# Patient Record
Sex: Female | Born: 1935 | Race: White | Hispanic: No | Smoking: Never smoker
Health system: Southern US, Community
[De-identification: ages and names within clinical notes are randomized; demographics above are authoritative.]

## PROBLEM LIST (undated history)

## (undated) DIAGNOSIS — E7800 Pure hypercholesterolemia, unspecified: Secondary | ICD-10-CM

## (undated) DIAGNOSIS — E079 Disorder of thyroid, unspecified: Secondary | ICD-10-CM

## (undated) DIAGNOSIS — I10 Essential (primary) hypertension: Secondary | ICD-10-CM

## (undated) HISTORY — PX: ABDOMINAL HYSTERECTOMY: SHX81

---

## 2011-01-31 ENCOUNTER — Ambulatory Visit
Admission: RE | Admit: 2011-01-31 | Discharge: 2011-01-31 | Disposition: A | Discharge status: Home / Self Care | Payer: Medicare Other | Source: Ambulatory Visit | Attending: Family Medicine | Admitting: Family Medicine

## 2011-01-31 ENCOUNTER — Other Ambulatory Visit: Payer: Self-pay | Admitting: Family Medicine

## 2011-01-31 ENCOUNTER — Encounter: Payer: Self-pay | Admitting: Family Medicine

## 2011-01-31 ENCOUNTER — Inpatient Hospital Stay (INDEPENDENT_AMBULATORY_CARE_PROVIDER_SITE_OTHER)
Admission: RE | Admit: 2011-01-31 | Discharge: 2011-01-31 | Disposition: A | Discharge status: Home / Self Care | Payer: Medicare Other | Source: Ambulatory Visit | Attending: Family Medicine | Admitting: Family Medicine

## 2011-01-31 DIAGNOSIS — M25569 Pain in unspecified knee: Secondary | ICD-10-CM

## 2011-01-31 DIAGNOSIS — E039 Hypothyroidism, unspecified: Secondary | ICD-10-CM | POA: Insufficient documentation

## 2011-01-31 DIAGNOSIS — I10 Essential (primary) hypertension: Secondary | ICD-10-CM | POA: Insufficient documentation

## 2011-07-03 NOTE — Progress Notes (Signed)
Summary: LT KNEE PAIN Room 5   Vital Signs:  Patient Profile:   77 Years Old Female CC:      Left knee pain x 2 days Height:     60 inches Weight:      169 pounds O2 Sat:      98 % O2 treatment:    Room Air Temp:     97.6 degrees F oral Pulse rate:   95 / minute Pulse rhythm:   regular Resp:     16 per minute BP sitting:   171 / 103  (left arm) Cuff size:   regular  Pt. in pain?   yes    Location:   knee    Intensity:   8    Type:       aching  Vitals Entered By: Emilio Math (January 31, 2011 5:51 PM)                   Current Allergies: No known allergies History of Present Illness Chief Complaint: Left knee pain x 2 days History of Present Illness:  Subjective:  Patient complains of some mild pain in left knee about two weeks ago after wearing some new shoes.  The pain resolved, but yesterday after sitting in a recliner for a while, she developed recurrent pain upon standing.  She now has pain toward the anterior aspect of her left knee upon standing and walking.  No swelling.  She recalls no trauma to the knee or change in activities.  No calf pain.  Current Meds LISINOPRIL 20 MG TABS (LISINOPRIL)  SYNTHROID 25 MCG TABS (LEVOTHYROXINE SODIUM)  VOLTAREN 1 % GEL (DICLOFENAC SODIUM) Apply four gm to affected joint three times a day to qid  REVIEW OF SYSTEMS Constitutional Symptoms      Denies fever, chills, night sweats, weight loss, weight gain, and fatigue.  Eyes       Denies change in vision, eye pain, eye discharge, glasses, contact lenses, and eye surgery. Ear/Nose/Throat/Mouth       Denies hearing loss/aids, change in hearing, ear pain, ear discharge, dizziness, frequent runny nose, frequent nose bleeds, sinus problems, sore throat, hoarseness, and tooth pain or bleeding.  Respiratory       Denies dry cough, productive cough, wheezing, shortness of breath, asthma, bronchitis, and emphysema/COPD.  Cardiovascular       Denies murmurs, chest pain, and tires  easily with exhertion.    Gastrointestinal       Denies stomach pain, nausea/vomiting, diarrhea, constipation, blood in bowel movements, and indigestion. Genitourniary       Denies painful urination, kidney stones, and loss of urinary control. Neurological       Denies paralysis, seizures, and fainting/blackouts. Musculoskeletal       Complains of muscle pain, joint pain, joint stiffness, and decreased range of motion.      Denies redness, swelling, muscle weakness, and gout.  Skin       Denies bruising, unusual mles/lumps or sores, and hair/skin or nail changes.  Psych       Denies mood changes, temper/anger issues, anxiety/stress, speech problems, depression, and sleep problems.  Past History:  Past Medical History: Hypertension Hypothyroidism  Past Surgical History: Hysterectomy Ankle  Social History: Lives alone   Objective:  Appearance:  Elderly female in no acute distress, alert and oriented  Left knee:  No effusion,  erythema, or warmth.  Mild swelling anteriorly.  Knee stable, negative drawer test.  McMurray test negative.  There  is tenderness over the lateral joint line, and just medial to the patella.  No tenderness in the popliteal area, and no calf tenderness.   Homan's test negative.  No calf warmth or erythema. X-ray left knee:  IMPRESSION: No acute osseous findings.  Meniscal chondrocalcinosis. Assessment New Problems: CHONDROCALCINOSIS (ICD-275.49) KNEE PAIN, LEFT, ACUTE (ICD-719.46) HYPOTHYROIDISM (ICD-244.9) HYPERTENSION (ICD-401.9)  PSEUDOGOUT  Plan New Medications/Changes: VOLTAREN 1 % GEL (DICLOFENAC SODIUM) Apply four gm to affected joint three times a day to qid  #100gm x 1, 01/31/2011, Donna Christen MD  New Orders: T-DG Knee 2 Views*L* [73560] New Patient Level III [99203] Services provided After hours-Weekends-Holidays [99051] Ace Wraps 3-5 in/yard  [W0981] Planning Comments:   Ace wrap applied.  Apply ice pack for 30 to 45 minutes every 1  to 4 hours.  Continue until pain decreases.  Begin Voltaren gel.  Recommend using walker for several days until improved. Followup with [facility] if not improved in two weeks.   The patient and/or caregiver has been counseled thoroughly with regard to medications prescribed including dosage, schedule, interactions, rationale for use, and possible side effects and they verbalize understanding.  Diagnoses and expected course of recovery discussed and will return if not improved as expected or if the condition worsens. Patient and/or caregiver verbalized understanding.  Prescriptions: VOLTAREN 1 % GEL (DICLOFENAC SODIUM) Apply four gm to affected joint three times a day to qid  #100gm x 1   Entered and Authorized by:   Donna Christen MD   Signed by:   Donna Christen MD on 01/31/2011   Method used:   Print then Give to Patient   RxID:   [id number]   Orders Added: 1)  T-DG Knee 2 Views*L* [73560] 2)  New Patient Level III [57846] 3)  Services provided After hours-Weekends-Holidays [99051] 4)  Ace Wraps 3-5 in/yard  [[id number]]

## 2014-05-16 ENCOUNTER — Emergency Department (INDEPENDENT_AMBULATORY_CARE_PROVIDER_SITE_OTHER): Payer: Medicare Other

## 2014-05-16 ENCOUNTER — Emergency Department (INDEPENDENT_AMBULATORY_CARE_PROVIDER_SITE_OTHER)
Admission: EM | Admit: 2014-05-16 | Discharge: 2014-05-16 | Disposition: A | Discharge status: Home / Self Care | Payer: Medicare Other | Source: Home / Self Care | Attending: Emergency Medicine | Admitting: Emergency Medicine

## 2014-05-16 ENCOUNTER — Encounter: Payer: Self-pay | Admitting: Emergency Medicine

## 2014-05-16 DIAGNOSIS — W19XXXA Unspecified fall, initial encounter: Secondary | ICD-10-CM

## 2014-05-16 DIAGNOSIS — M25579 Pain in unspecified ankle and joints of unspecified foot: Secondary | ICD-10-CM

## 2014-05-16 DIAGNOSIS — S92302A Fracture of unspecified metatarsal bone(s), left foot, initial encounter for closed fracture: Secondary | ICD-10-CM

## 2014-05-16 DIAGNOSIS — S92352A Displaced fracture of fifth metatarsal bone, left foot, initial encounter for closed fracture: Secondary | ICD-10-CM

## 2014-05-16 HISTORY — DX: Essential (primary) hypertension: I10

## 2014-05-16 HISTORY — DX: Disorder of thyroid, unspecified: E07.9

## 2014-05-16 HISTORY — DX: Pure hypercholesterolemia, unspecified: E78.00

## 2014-05-16 NOTE — ED Notes (Signed)
Patient fell yesterday.  C/o left foot and fifth toe pain with weight bearing only.  Denies pain without weight bearing, walking.

## 2014-05-16 NOTE — ED Provider Notes (Signed)
CSN: [id number]     Arrival date & time 05/16/14  1058 History   First MD Initiated Contact with Patient 05/16/14 1137     Chief Complaint  Patient presents with  . Foot Injury   (Consider location/radiation/quality/duration/timing/severity/associated sxs/prior Treatment) Patient is a 80 y.o. female presenting with foot injury. The history is provided by the patient. No language interpreter was used.  Foot Injury Location:  Foot Time since incident:  1 day Foot location:  L foot Pain details:    Quality:  Aching   Radiates to:  Does not radiate   Severity:  Mild   Onset quality:  Gradual   Duration:  1 day   Timing:  Constant   Progression:  Worsening Chronicity:  New Dislocation: no   Foreign body present:  No foreign bodies Tetanus status:  Out of date Relieved by:  Nothing Worsened by:  Nothing tried Ineffective treatments:  None tried Associated symptoms: swelling   Risk factors: no known bone disorder     Past Medical History  Diagnosis Date  . Hypertension   . Thyroid disease   . Hypercholesterolemia    History reviewed. No pertinent past surgical history. History reviewed. No pertinent family history. History  Substance Use Topics  . Smoking status: Never Smoker   . Smokeless tobacco: Never Used  . Alcohol Use: No   OB History   Grav Para Term Preterm Abortions TAB SAB Ect Mult Living                 Review of Systems  Musculoskeletal: Positive for joint swelling.  All other systems reviewed and are negative.   Allergies  Review of patient's allergies indicates not on file.  Home Medications   Prior to Admission medications   Medication Sig Start Date End Date Taking? Authorizing Provider  Levothyroxine Sodium (SYNTHROID PO) Take by mouth.   Yes Historical Provider, MD  LISINOPRIL PO Take by mouth.   Yes Historical Provider, MD  LOVASTATIN PO Take by mouth.   Yes Historical Provider, MD   BP 206/109  Pulse 72  Temp(Src) 98 F (36.7 C)  (Oral)  Resp 18  Ht 5' (1.524 m)  Wt 165 lb (74.844 kg)  BMI 32.22 kg/m2  SpO2 98% Physical Exam  Nursing note and vitals reviewed. Constitutional: She appears well-developed and well-nourished.  HENT:  Head: Normocephalic and atraumatic.  Musculoskeletal: She exhibits tenderness.  Tender lower left foot and 5th toe,  Bruised,   nv and ns intact  Neurological: She is alert.  Skin: Skin is warm.  Psychiatric: She has a normal mood and affect.    ED Course  Procedures (including critical care time) Labs Review Labs Reviewed - No data to display  Imaging Review Dg Foot Complete Left  05/16/2014   CLINICAL DATA:  Status post fall 1 day ago with lateral left foot pain since. Initial in count.  EXAM: LEFT FOOT - COMPLETE 3+ VIEW  COMPARISON:  None.  FINDINGS: The patient has a fracture of the fifth metatarsal. The fracture extends from the proximal metaphysis distally through the distal metaphysis. The fracture is nondisplaced. No other acute bony or joint abnormality is identified. Marked soft tissue swelling about the foot is seen.  IMPRESSION: Acute fifth metatarsal fracture with associated soft tissue swelling.   Electronically Signed   By: Drusilla Kannerhomas  Dalessio M.D.   On: 05/16/2014 12:45     MDM   1. Pain in joint, ankle and foot, unspecified laterality   2.  Fracture of 5th metatarsal, left, closed, initial encounter    Ace wrap Post op shoe AVS Follow up with Dr. Karie Schwalbet in one week Tylenol for pain    Elson AreasLeslie K Andrews Tener, PA-C 05/16/14 1309

## 2014-05-16 NOTE — ED Notes (Signed)
Returns to exam room. 

## 2014-05-16 NOTE — Discharge Instructions (Signed)
Metatarsal Fracture, Undisplaced  A metatarsal fracture is a break in the bone(s) of the foot. These are the bones of the foot that connect your toes to the bones of the ankle.  DIAGNOSIS   The diagnoses of these fractures are usually made with X-rays. If there are problems in the forefoot and x-rays are normal a later bone scan will usually make the diagnosis.   TREATMENT AND HOME CARE INSTRUCTIONS  · Treatment may or may not include a cast or walking shoe. When casts are needed the use is usually for short periods of time so as not to slow down healing with muscle wasting (atrophy).  · Activities should be stopped until further advised by your caregiver.  · Wear shoes with adequate shock absorbing capabilities and stiff soles.  · Alternative exercise may be undertaken while waiting for healing. These may include bicycling and swimming, or as your caregiver suggests.  · It is important to keep all follow-up visits or specialty referrals. The failure to keep these appointments could result in improper bone healing and chronic pain or disability.  · Warning: Do not drive a car or operate a motor vehicle until your caregiver specifically tells you it is safe to do so.  IF YOU DO NOT HAVE A CAST OR SPLINT:  · You may walk on your injured foot as tolerated or advised.  · Do not put any weight on your injured foot for as long as directed by your caregiver. Slowly increase the amount of time you walk on the foot as the pain allows or as advised.  · Use crutches until you can bear weight without pain. A gradual increase in weight bearing may help.  · Apply ice to the injury for 15-20 minutes each hour while awake for the first 2 days. Put the ice in a plastic bag and place a towel between the bag of ice and your skin.  · Only take over-the-counter or prescription medicines for pain, discomfort, or fever as directed by your caregiver.  SEEK IMMEDIATE MEDICAL CARE IF:   · Your cast gets damaged or breaks.  · You have  continued severe pain or more swelling than you did before the cast was put on, or the pain is not controlled with medications.  · Your skin or nails below the injury turn blue or grey, or feel cold or numb.  · There is a bad smell, or new stains or pus-like (purulent) drainage coming from the cast.  MAKE SURE YOU:   · Understand these instructions.  · Will watch your condition.  · Will get help right away if you are not doing well or get worse.  Document Released: 04/08/2002 Document Revised: 10/09/2011 Document Reviewed: 02/28/2008  ExitCare® Patient Information ©2015 [employer]. This information is not intended to replace advice given to you by your health care provider. Make sure you discuss any questions you have with your health care provider.

## 2014-05-18 NOTE — ED Provider Notes (Signed)
Medical history/examination/treatment/procedure(s) were performed by non-physician provider and as supervising physician I was immediately available for consultation/collaboration.   Lajean Manesavid Massey, MD 05/18/14 [phone]

## 2014-05-26 ENCOUNTER — Encounter: Payer: Self-pay | Admitting: Sports Medicine

## 2014-05-26 ENCOUNTER — Ambulatory Visit (INDEPENDENT_AMBULATORY_CARE_PROVIDER_SITE_OTHER): Payer: Medicare Other | Admitting: Sports Medicine

## 2014-05-26 VITALS — BP 170/93 | HR 74 | Ht 60.0 in | Wt 171.0 lb

## 2014-05-26 DIAGNOSIS — S92352A Displaced fracture of fifth metatarsal bone, left foot, initial encounter for closed fracture: Secondary | ICD-10-CM

## 2014-05-26 MED ORDER — CALCIUM CARBONATE-VITAMIN D 600-400 MG-UNIT PO TABS: 1.0000 | ORAL_TABLET | Freq: Two times a day (BID) | ORAL | Status: AC

## 2014-05-26 NOTE — Progress Notes (Signed)
   Subjective:    I'm seeing this patient as a consultation for:  Christina MaskerKaren Sofia, PA-C  CC: left foot fracture  HPI: This is an extremely pleasant 80 year old female, several days ago she tried to stand him a stool, fell off, and injured her left foot, she had immediate pain, swelling, bruising. She was seen in urgent care where x-rays showed a fracture, and a couple of locations at the base and shaft of her fifth metatarsal. She was placed in a postop shoe appropriately, and referred to me for further evaluation and definitive treatment. Overall the foot is not painful right now, and she has been doing a good job staying off. When she does have to bear weight she uses her walker.  Past medical history, Surgical history, Family history not pertinant except as noted below, Social history, Allergies, and medications have been entered into the medical record, reviewed, and no changes needed.   Review of Systems: No headache, visual changes, nausea, vomiting, diarrhea, constipation, dizziness, abdominal pain, skin rash, fevers, chills, night sweats, weight loss, swollen lymph nodes, body aches, joint swelling, muscle aches, chest pain, shortness of breath, mood changes, visual or auditory hallucinations.   Objective:   General: Well Developed, well nourished, and in no acute distress.  Neuro/Psych: Alert and oriented x3, extra-ocular muscles intact, able to move all 4 extremities, sensation grossly intact. Skin: Warm and dry, no rashes noted.  Respiratory: Not using accessory muscles, speaking in full sentences, trachea midline.  Cardiovascular: Pulses palpable, no extremity edema. Abdomen: Does not appear distended. Left foot: Swollen, bruised, tender to palpation over the shaft and the base of the fifth metatarsal. Neurovascularly intact distally.  X-rays personally reviewed and show a spiral fracture through the distal shaft as well as the proximal base of the fifth metatarsal without angulation  or displacement.  Impression and Recommendations:   This case required medical decision making of moderate complexity.

## 2014-05-26 NOTE — Assessment & Plan Note (Signed)
Essentially pain-free. Strap with compressive dressing, continue postop shoe. Return in 3 weeks, x-ray before visit. Calcium and vitamin D supplement. Continue minimal weightbearing for the next 2 weeks, then gentle weightbearing with a walker.  I billed a fracture code for this encounter, all subsequent visits will be post-op checks in the global period.

## 2014-05-26 NOTE — Patient Instructions (Signed)
Return in 3 weeks, x-ray before visit. Calcium and vitamin D supplement. Continue minimal weightbearing for the next 2 weeks, then gentle weightbearing with a walker.

## 2014-06-16 ENCOUNTER — Ambulatory Visit (INDEPENDENT_AMBULATORY_CARE_PROVIDER_SITE_OTHER): Payer: Medicare Other | Admitting: Sports Medicine

## 2014-06-16 ENCOUNTER — Ambulatory Visit (INDEPENDENT_AMBULATORY_CARE_PROVIDER_SITE_OTHER): Payer: Medicare Other

## 2014-06-16 ENCOUNTER — Encounter: Payer: Self-pay | Admitting: Sports Medicine

## 2014-06-16 DIAGNOSIS — S92355D Nondisplaced fracture of fifth metatarsal bone, left foot, subsequent encounter for fracture with routine healing: Secondary | ICD-10-CM

## 2014-06-16 DIAGNOSIS — S92352A Displaced fracture of fifth metatarsal bone, left foot, initial encounter for closed fracture: Secondary | ICD-10-CM

## 2014-06-16 NOTE — Patient Instructions (Signed)
Continue postop shoe for another 2 weeks then discontinue and wear a regular shoe, return to see me in 3 weeks.

## 2014-06-16 NOTE — Progress Notes (Signed)
  Subjective: 3 weeks post fractures of the neck and base of the left fifth metatarsal, pain-free. She has been wearing her postop shoe and compressive wrap.   Objective: General: Well-developed, well-nourished, and in no acute distress. Left foot: No tenderness to palpation over the fracture site, she is tender at the talocrural joint.  X-rays personally reviewed, there is overall maintained alignment of the fracture with blurring of the fracture margins, there is a hint of bony callus around the fractures as well.  Assessment/plan:

## 2014-06-16 NOTE — Assessment & Plan Note (Signed)
Doing extremely well. Continue postop shoe for another 2 weeks, strapped with compressive dressing.  Return to see me in 3 weeks, single additional x-ray before visit. 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

## 2014-07-08 ENCOUNTER — Ambulatory Visit (INDEPENDENT_AMBULATORY_CARE_PROVIDER_SITE_OTHER): Payer: Medicare Other | Admitting: Sports Medicine

## 2014-07-08 ENCOUNTER — Other Ambulatory Visit: Payer: Self-pay | Admitting: Sports Medicine

## 2014-07-08 ENCOUNTER — Ambulatory Visit (INDEPENDENT_AMBULATORY_CARE_PROVIDER_SITE_OTHER): Payer: Medicare Other

## 2014-07-08 ENCOUNTER — Encounter: Payer: Self-pay | Admitting: Sports Medicine

## 2014-07-08 VITALS — BP 158/80 | HR 79 | Wt 169.0 lb

## 2014-07-08 DIAGNOSIS — S92302D Fracture of unspecified metatarsal bone(s), left foot, subsequent encounter for fracture with routine healing: Secondary | ICD-10-CM

## 2014-07-08 DIAGNOSIS — W19XXXD Unspecified fall, subsequent encounter: Secondary | ICD-10-CM

## 2014-07-08 DIAGNOSIS — S92352D Displaced fracture of fifth metatarsal bone, left foot, subsequent encounter for fracture with routine healing: Secondary | ICD-10-CM

## 2014-07-08 DIAGNOSIS — S92352A Displaced fracture of fifth metatarsal bone, left foot, initial encounter for closed fracture: Secondary | ICD-10-CM

## 2014-07-08 NOTE — Assessment & Plan Note (Signed)
Clinically resolved, return as needed. 

## 2014-07-08 NOTE — Progress Notes (Signed)
  Subjective: Approximate 6 weeks post fractures of the left neck and base of the fifth metatarsal, pain-free.   Objective: General: Well-developed, well-nourished, and in no acute distress. Left Foot: No visible erythema or swelling. Range of motion is full in all directions. Strength is 5/5 in all directions. No hallux valgus. No pes cavus or pes planus. No abnormal callus noted. No pain over the navicular prominence, or base of fifth metatarsal. No tenderness to palpation of the calcaneal insertion of plantar fascia. No pain at the Achilles insertion. No pain over the calcaneal bursa. No pain of the retrocalcaneal bursa. No tenderness to palpation over the tarsals, metatarsals, or phalanges. No hallux rigidus or limitus. No tenderness palpation over interphalangeal joints. No pain with compression of the metatarsal heads. Neurovascularly intact distally.  X-rays were personally reviewed and show good healing of the fractures with bony callus formation as expected.  Assessment/plan:

## 2015-06-16 ENCOUNTER — Encounter: Payer: Self-pay | Admitting: *Deleted

## 2015-06-16 ENCOUNTER — Emergency Department (INDEPENDENT_AMBULATORY_CARE_PROVIDER_SITE_OTHER)
Admission: EM | Admit: 2015-06-16 | Discharge: 2015-06-16 | Disposition: A | Discharge status: Home / Self Care | Payer: Medicare Other | Source: Home / Self Care | Attending: Family Medicine | Admitting: Family Medicine

## 2015-06-16 DIAGNOSIS — R300 Dysuria: Secondary | ICD-10-CM | POA: Diagnosis not present

## 2015-06-16 LAB — POCT URINALYSIS DIP (MANUAL ENTRY)
Bilirubin, UA: NEGATIVE
GLUCOSE UA: NEGATIVE
Ketones, POC UA: NEGATIVE
Leukocytes, UA: NEGATIVE
NITRITE UA: NEGATIVE
PROTEIN UA: NEGATIVE
Spec Grav, UA: 1.02 (ref 1.005–1.03)
UROBILINOGEN UA: 0.2 (ref 0–1)
pH, UA: 6 (ref 5–8)

## 2015-06-16 MED ORDER — CEPHALEXIN 500 MG PO CAPS: 500.0000 mg | ORAL_CAPSULE | Freq: Two times a day (BID) | ORAL | Status: AC

## 2015-06-16 NOTE — ED Provider Notes (Signed)
CSN: [id number]     Arrival date & time 06/16/15  1706 History   First MD Initiated Contact with Patient 06/16/15 1734     Chief Complaint  Patient presents with  . Dysuria      HPI Comments: Patient complains of onset of dysuria and frequency yesterday.  No fever. She states that she was treated for a UTI 2 weeks ago with Cipro BID for one week.  She was assymptomatic when she finished the Cipro 4 days ago.  Patient is a 81 y.o. female presenting with dysuria. The history is provided by the patient.  Dysuria Pain quality:  Burning Pain severity:  No pain Onset quality:  Sudden Duration:  1 day Timing:  Constant Progression:  Worsening Chronicity:  Recurrent Recent urinary tract infections: yes   Relieved by:  None tried Worsened by:  Nothing tried Ineffective treatments:  None tried Urinary symptoms: frequent urination and hesitancy   Urinary symptoms: no discolored urine, no foul-smelling urine, no hematuria and no bladder incontinence   Associated symptoms: no abdominal pain, no fever, no flank pain, no genital lesions, no nausea, no vaginal discharge and no vomiting     Past Medical History  Diagnosis Date  . Hypertension   . Thyroid disease   . Hypercholesterolemia    Past Surgical History  Procedure Laterality Date  . Abdominal hysterectomy     History reviewed. No pertinent family history. Social History  Substance Use Topics  . Smoking status: Never Smoker   . Smokeless tobacco: Never Used  . Alcohol Use: No   OB History    No data available     Review of Systems  Constitutional: Negative for fever.  Gastrointestinal: Negative for nausea, vomiting and abdominal pain.  Genitourinary: Positive for dysuria. Negative for flank pain and vaginal discharge.  All other systems reviewed and are negative.   Allergies  Review of patient's allergies indicates no known allergies.  Home Medications   Prior to Admission medications   Medication Sig Start Date  End Date Taking? Authorizing Provider  Calcium Carbonate-Vitamin D 600-400 MG-UNIT per tablet Take 1 tablet by mouth 2 (two) times daily. 05/26/14   Monica Bectonhomas J Thekkekandam, MD  cephALEXin (KEFLEX) 500 MG capsule Take 1 capsule (500 mg total) by mouth 2 (two) times daily. 06/16/15   Lattie HawStephen A Beese, MD  Levothyroxine Sodium (SYNTHROID PO) Take by mouth.    Historical Provider, MD  LISINOPRIL PO Take by mouth.    Historical Provider, MD  LOVASTATIN PO Take by mouth.    Historical Provider, MD   Meds Ordered and Administered this Visit  Medications - No data to display  BP 117/72 mmHg  Pulse 100  Temp(Src) 97.9 F (36.6 C) (Oral)  Resp 16  Ht 5' (1.524 m)  Wt 165 lb (74.844 kg)  BMI 32.22 kg/m2  SpO2 98% No data found.   Physical Exam Nursing notes and Vital Signs reviewed. Appearance:  Patient appears stated age, and in no acute distress Eyes:  Pupils are equal, round, and reactive to light and accomodation.  Extraocular movement is intact.  Conjunctivae are not inflamed  Nose:  Normal Pharynx:  Normal Neck:  Supple.  No adenopathy Lungs:  Clear to auscultation.  Breath sounds are equal.  Moving air well. Heart:  Regular rate and rhythm without murmurs, rubs, or gallops.  Abdomen:  Nontender without masses or hepatosplenomegaly.  Bowel sounds are present.  No CVA or flank tenderness.  Extremities:  No edema.  No calf  tenderness Skin:  No rash present.   ED Course  Procedures  None    Labs Reviewed  POCT URINALYSIS DIP (MANUAL ENTRY) - Abnormal; Notable for the following:    Blood, UA trace-intact (*)    All other components within normal limits  URINE CULTURE      MDM   1. Dysuria    Urine culture pending. Begin Keflex  BID for one week. Increase fluid intake. If symptoms become significantly worse during the night or over the weekend, proceed to the local emergency room.  Followup with Family Doctor if not improved in one week.     Lattie Haw,  MD 06/21/15 [phone]

## 2015-06-16 NOTE — Discharge Instructions (Signed)
Increase fluid intake.  May use non-prescription Cystex for about two days, if desired, to decrease urinary discomfort. If symptoms become significantly worse during the night or over the weekend, proceed to the local emergency room.    Dysuria Dysuria is pain or discomfort while urinating. The pain or discomfort may be felt in the tube that carries urine out of the bladder (urethra) or in the surrounding tissue of the genitals. The pain may also be felt in the groin area, lower abdomen, and lower back. You may have to urinate frequently or have the sudden feeling that you have to urinate (urgency). Dysuria can affect both men and women, but is more common in women. Dysuria can be caused by many different things, including:  Urinary tract infection in women.  Infection of the kidney or bladder.  Kidney stones or bladder stones.  Certain sexually transmitted infections (STIs), such as chlamydia.  Dehydration.  Inflammation of the vagina.  Use of certain medicines.  Use of certain soaps or scented products that cause irritation. HOME CARE INSTRUCTIONS Watch your dysuria for any changes. The following actions may help to reduce any discomfort you are feeling:  Drink enough fluid to keep your urine clear or pale yellow.  Empty your bladder often. Avoid holding urine for long periods of time.  After a bowel movement or urination, women should cleanse from front to back, using each tissue only once.  Empty your bladder after sexual intercourse.  Take medicines only as directed by your health care provider.  If you were prescribed an antibiotic medicine, finish it all even if you start to feel better.  Avoid caffeine, tea, and alcohol. They can irritate the bladder and make dysuria worse. In men, alcohol may irritate the prostate.  Keep all follow-up visits as directed by your health care provider. This is important.  If you had any tests done to find the cause of dysuria, it is  your responsibility to obtain your test results. Ask the lab or department performing the test when and how you will get your results. Talk with your health care provider if you have any questions about your results. SEEK MEDICAL CARE IF:  You develop pain in your back or sides.  You have a fever.  You have nausea or vomiting.  You have blood in your urine.  You are not urinating as often as you usually do. SEEK IMMEDIATE MEDICAL CARE IF:  You pain is severe and not relieved with medicines.  You are unable to hold down any fluids.  You or someone else notices a change in your mental function.  You have a rapid heartbeat at rest.  You have shaking or chills.  You feel extremely weak.   This information is not intended to replace advice given to you by your health care provider. Make sure you discuss any questions you have with your health care provider.   Document Released: 04/14/2004 Document Revised: 08/07/2014 Document Reviewed: 03/12/2014 Elsevier Interactive Patient Education [employer]2016 [employer]

## 2015-06-16 NOTE — ED Notes (Signed)
Pt c/o frequent urination and dysuria x 1 day. Denies fever. Reports recent UTI was given Cipro for 7 days on 06/06/15. S/s cleared after ABT.

## 2015-06-17 LAB — URINE CULTURE
Colony Count: NO GROWTH
ORGANISM ID, BACTERIA: NO GROWTH

## 2015-06-18 ENCOUNTER — Telehealth: Payer: Self-pay | Admitting: *Deleted

## 2015-06-20 ENCOUNTER — Telehealth: Payer: Self-pay | Admitting: *Deleted

## 2016-02-09 IMAGING — CR DG FOOT COMPLETE 3+V*L*
3 series · 3 of 3 positions shown · non-contrast
Comparison: 06/16/2014.

CLINICAL DATA: Follow-up fifth metatarsal fracture. Fall 6 weeks
ago. Follow-up evaluation .

EXAM:
LEFT FOOT - COMPLETE 3+ VIEW

[view not recorded (1 of 3)]
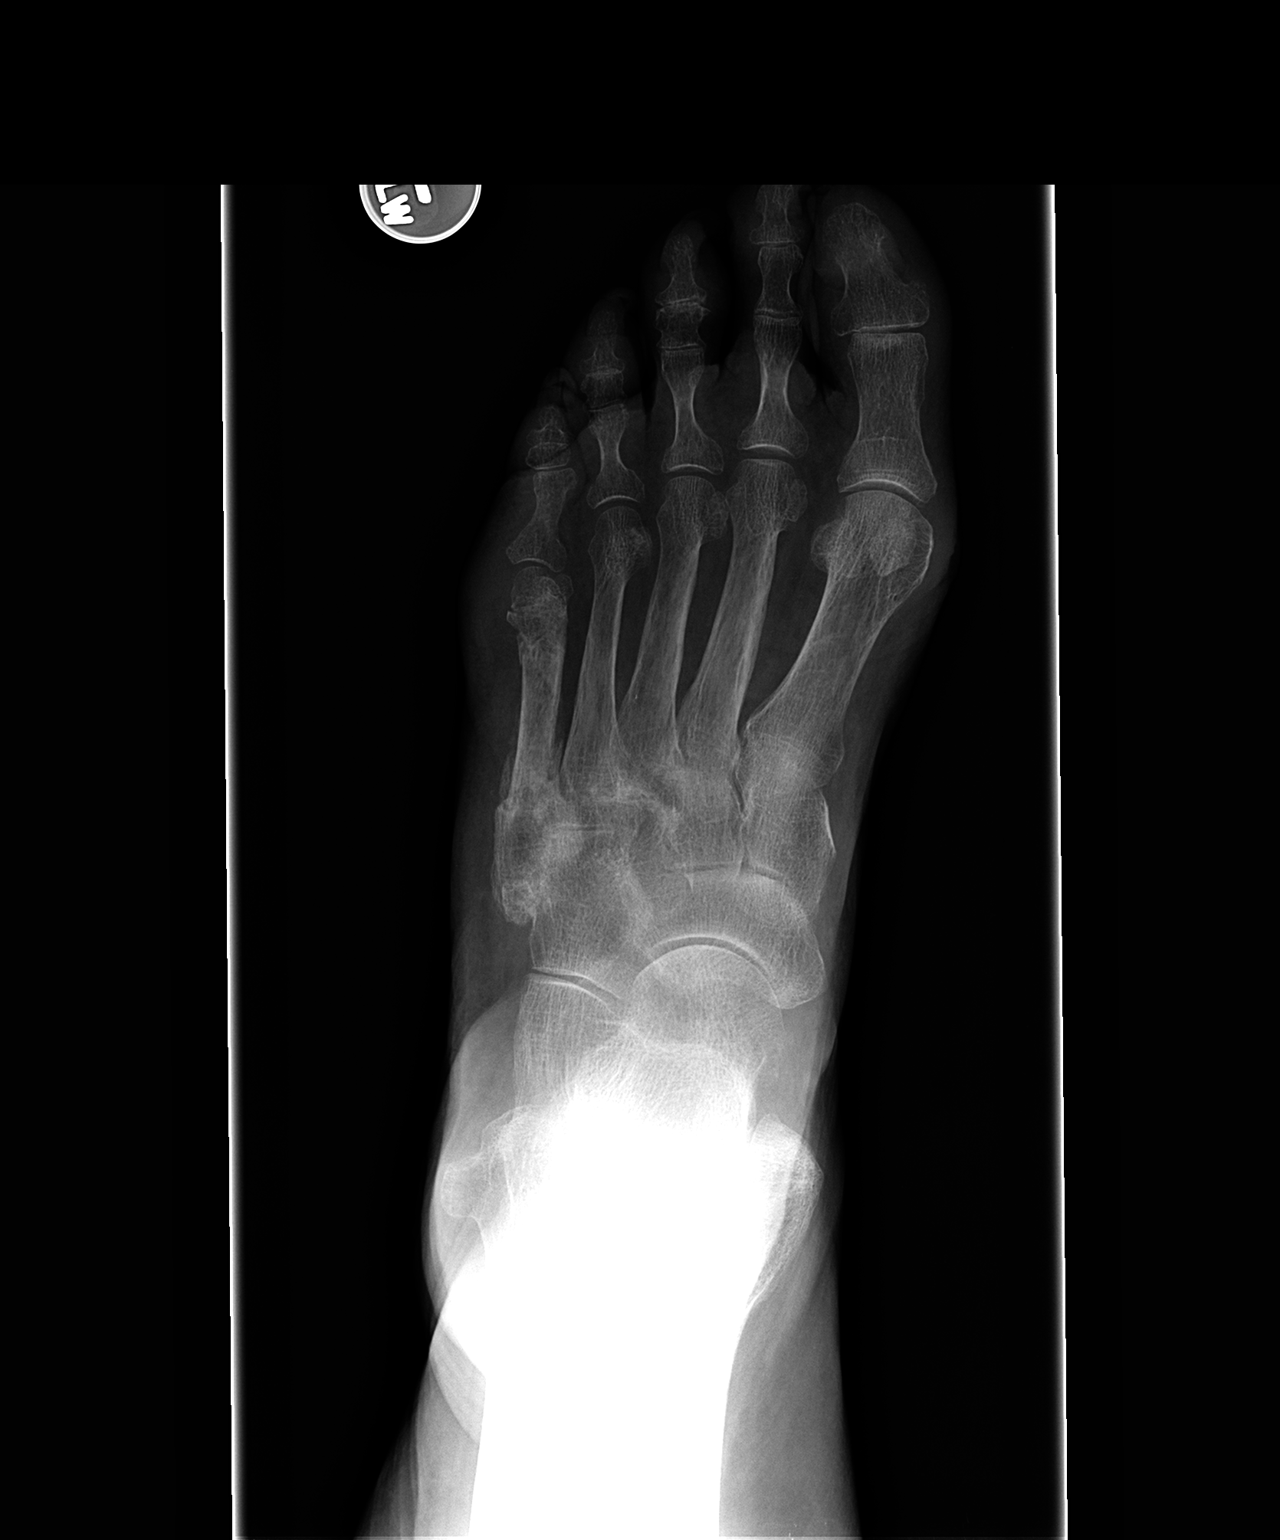

[view not recorded (2 of 3)]
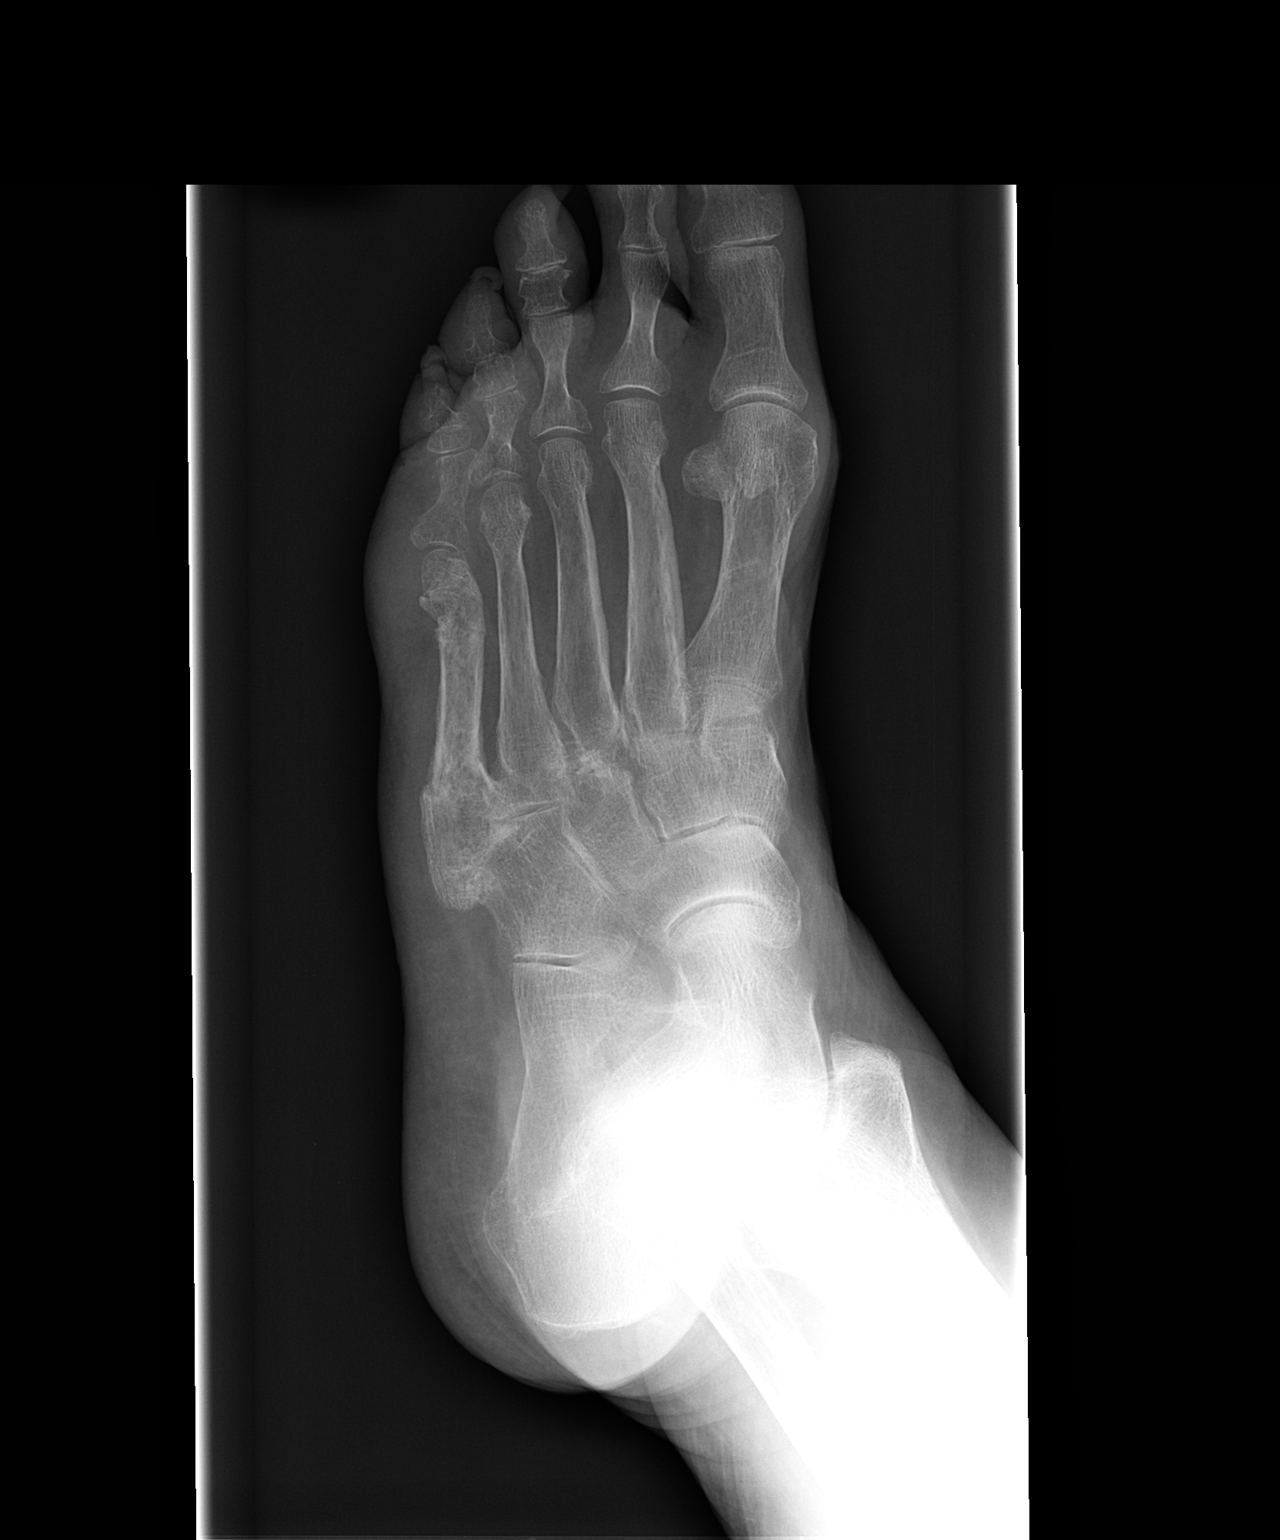

[view not recorded (3 of 3)]
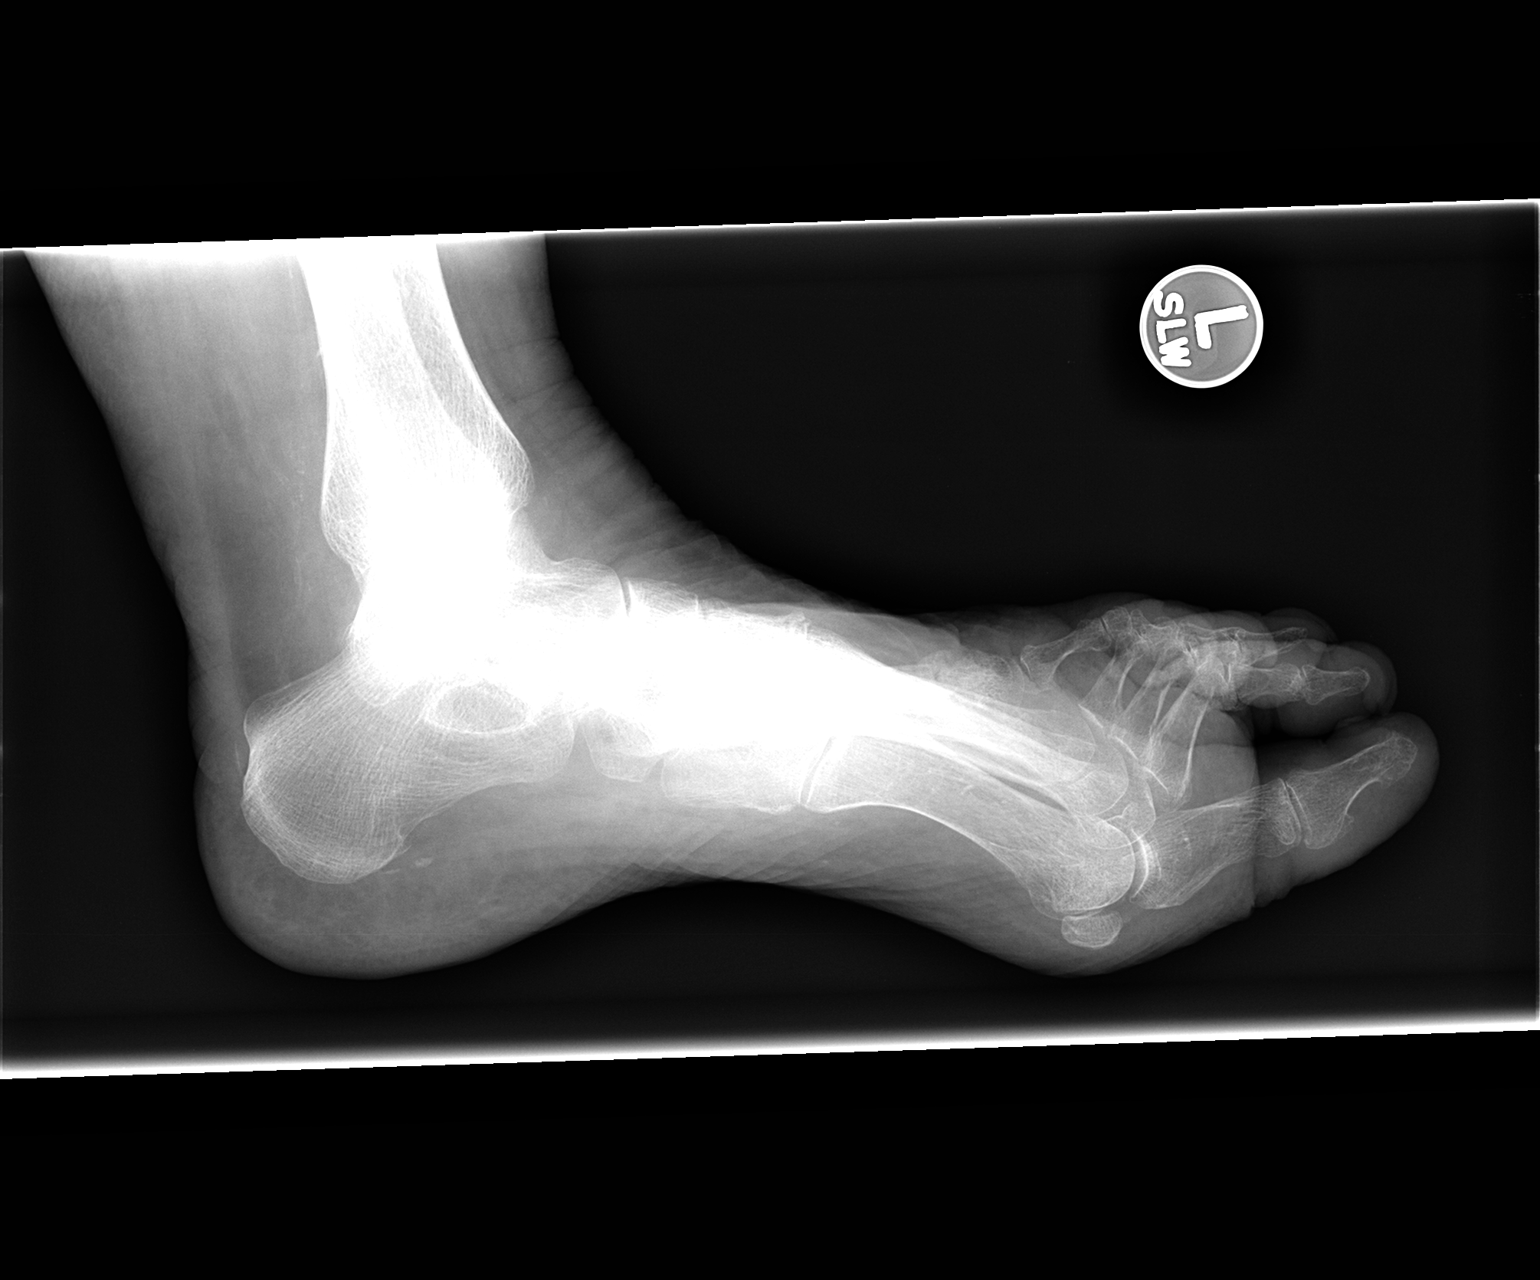

[3 of 3 positions shown; findings below may reference images not displayed]

FINDINGS: Fracture of the base of the left fifth metatarsal is again noted.
Minimal callus formation noted. Diffuse osteopenia degenerative
change.
IMPRESSION: Fracture of the base of the left fifth metatarsal again noted.
Minimal callus formation noted at this time.

## 2018-02-28 DEATH — deceased
